# Patient Record
Sex: Female | Born: 1995 | Race: Black or African American | Hispanic: No | Marital: Single | Smoking: Never smoker
Health system: Southern US, Community
[De-identification: ages and names within clinical notes are randomized; demographics above are authoritative.]

## PROBLEM LIST (undated history)

## (undated) DIAGNOSIS — E119 Type 2 diabetes mellitus without complications: Secondary | ICD-10-CM

---
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
## 2017-11-05 ENCOUNTER — Encounter (HOSPITAL_BASED_OUTPATIENT_CLINIC_OR_DEPARTMENT_OTHER): Payer: Self-pay | Admitting: Emergency Medicine

## 2017-11-05 ENCOUNTER — Other Ambulatory Visit: Payer: Self-pay

## 2017-11-05 ENCOUNTER — Emergency Department (HOSPITAL_BASED_OUTPATIENT_CLINIC_OR_DEPARTMENT_OTHER)
Admission: EM | Admit: 2017-11-05 | Discharge: 2017-11-05 | Disposition: A | Discharge status: Home / Self Care | Payer: BLUE CROSS/BLUE SHIELD | Attending: Emergency Medicine | Admitting: Emergency Medicine

## 2017-11-05 DIAGNOSIS — R1032 Left lower quadrant pain: Secondary | ICD-10-CM | POA: Diagnosis present

## 2017-11-05 DIAGNOSIS — N10 Acute tubulo-interstitial nephritis: Secondary | ICD-10-CM | POA: Insufficient documentation

## 2017-11-05 DIAGNOSIS — R739 Hyperglycemia, unspecified: Secondary | ICD-10-CM

## 2017-11-05 DIAGNOSIS — N12 Tubulo-interstitial nephritis, not specified as acute or chronic: Secondary | ICD-10-CM

## 2017-11-05 LAB — CBC WITH DIFFERENTIAL/PLATELET
Basophils Absolute: 0 10*3/uL (ref 0.0–0.1)
Basophils Relative: 0 %
EOS PCT: 0 %
Eosinophils Absolute: 0 10*3/uL (ref 0.0–0.7)
HCT: 33.4 % — ABNORMAL LOW (ref 36.0–46.0)
Hemoglobin: 11 g/dL — ABNORMAL LOW (ref 12.0–15.0)
Lymphocytes Relative: 9 %
Lymphs Abs: 0.9 10*3/uL (ref 0.7–4.0)
MCH: 22.9 pg — ABNORMAL LOW (ref 26.0–34.0)
MCHC: 32.9 g/dL (ref 30.0–36.0)
MCV: 69.6 fL — AB (ref 78.0–100.0)
MONO ABS: 1.1 10*3/uL — AB (ref 0.1–1.0)
MONOS PCT: 11 %
NEUTROS PCT: 80 %
Neutro Abs: 7.9 10*3/uL — ABNORMAL HIGH (ref 1.7–7.7)
PLATELETS: 275 10*3/uL (ref 150–400)
RBC: 4.8 MIL/uL (ref 3.87–5.11)
RDW: 14.7 % (ref 11.5–15.5)
WBC: 9.9 10*3/uL (ref 4.0–10.5)

## 2017-11-05 LAB — BASIC METABOLIC PANEL
Anion gap: 13 (ref 5–15)
BUN: 12 mg/dL (ref 6–20)
CO2: 19 mmol/L — ABNORMAL LOW (ref 22–32)
Calcium: 9 mg/dL (ref 8.9–10.3)
Chloride: 96 mmol/L — ABNORMAL LOW (ref 101–111)
Creatinine, Ser: 0.88 mg/dL (ref 0.44–1.00)
GFR calc Af Amer: >60 mL/min (ref >60)
GLUCOSE: 337 mg/dL — AB (ref 65–99)
Potassium: 3.8 mmol/L (ref 3.5–5.1)
SODIUM: 128 mmol/L — AB (ref 135–145)

## 2017-11-05 LAB — URINALYSIS, ROUTINE W REFLEX MICROSCOPIC
Bilirubin Urine: NEGATIVE
Glucose, UA: >=500 mg/dL — AB
Ketones, ur: >80 mg/dL — AB
Nitrite: POSITIVE — AB
Protein, ur: 100 mg/dL — AB
Specific Gravity, Urine: 1.025 (ref 1.005–1.030)
pH: 6 (ref 5.0–8.0)

## 2017-11-05 LAB — CBG MONITORING, ED: Glucose-Capillary: 340 mg/dL — ABNORMAL HIGH (ref 65–99)

## 2017-11-05 LAB — I-STAT VENOUS BLOOD GAS, ED
Acid-base deficit: 5 mmol/L — ABNORMAL HIGH (ref 0.0–2.0)
Bicarbonate: 19.2 mmol/L — ABNORMAL LOW (ref 20.0–28.0)
O2 Saturation: 73 %
PH VEN: 7.365 (ref 7.250–7.430)
TCO2: 20 mmol/L — ABNORMAL LOW (ref 22–32)
pCO2, Ven: 33.8 mmHg — ABNORMAL LOW (ref 44.0–60.0)
pO2, Ven: 41 mmHg (ref 32.0–45.0)

## 2017-11-05 LAB — URINALYSIS, MICROSCOPIC (REFLEX)

## 2017-11-05 LAB — PREGNANCY, URINE: Preg Test, Ur: NEGATIVE

## 2017-11-05 MED ORDER — METFORMIN HCL 500 MG PO TABS: 500.0000 mg | ORAL_TABLET | Freq: Two times a day (BID) | ORAL | 0 refills | Status: DC

## 2017-11-05 MED ORDER — SODIUM CHLORIDE 0.9 % IV BOLUS
1000.0000 mL | Freq: Once | INTRAVENOUS | Status: AC
  Administered 2017-11-05: 1000 mL via INTRAVENOUS

## 2017-11-05 MED ORDER — CEPHALEXIN 500 MG PO CAPS: 500.0000 mg | ORAL_CAPSULE | Freq: Two times a day (BID) | ORAL | 0 refills | Status: AC

## 2017-11-05 MED ORDER — CEPHALEXIN 500 MG PO CAPS: 500.0000 mg | ORAL_CAPSULE | Freq: Two times a day (BID) | ORAL | 0 refills | Status: DC

## 2017-11-05 MED ORDER — ACETAMINOPHEN 500 MG PO TABS
1000.0000 mg | ORAL_TABLET | Freq: Once | ORAL | Status: AC
  Administered 2017-11-05: 1000 mg via ORAL
  Filled 2017-11-05: qty 2

## 2017-11-05 MED ORDER — SODIUM CHLORIDE 0.9 % IV SOLN
1.0000 g | Freq: Once | INTRAVENOUS | Status: AC
  Administered 2017-11-05: 1 g via INTRAVENOUS
  Filled 2017-11-05: qty 10

## 2017-11-05 NOTE — ED Provider Notes (Signed)
[facility] EMERGENCY DEPARTMENT Provider Note   CSN: [id number] Arrival date & time: 11/05/17  1803  History   Chief Complaint Chief Complaint  Patient presents with  . Abdominal Pain    Lateral    HPI Alice Oliver is a 22 y.o. female.  HPI  Patient presents with L flank pain for 5d. Pain is present constantly, however is worse with deep inspiration and when lying on the opposite side. Describes pain as sharp. Has not taken any meds for pain. Has not tried heating pad or ice packs. Denies dysuria, hematuria. Endorses increased urinary frequency and says she feels as if she cannot fully empty her bladder and only urinates a few drops. Denies N/V, diarrhea, fevers, abd pain. Questionable chills. No new activities or exercises or trauma preceding onset of pain. Currently menstruating.    No past medical history on file.  There are no active problems to display for this patient.      OB History   None      Home Medications    Prior to Admission medications   Medication Sig Start Date End Date Taking? Authorizing Provider  cephALEXin (KEFLEX) 500 MG capsule Take 1 capsule (500 mg total) by mouth 2 (two) times daily for 10 days. 11/05/17 11/15/17  Marquette SaaLancaster, Abigail Joseph, MD  metFORMIN (GLUCOPHAGE) 500 MG tablet Take 1 tablet (500 mg total) by mouth 2 (two) times daily with a meal. 11/05/17 12/05/17  Marquette SaaLancaster, Abigail Joseph, MD    Family History No family history on file.  Social History Social History   Tobacco Use  . Smoking status: Never Smoker  . Smokeless tobacco: Never Used  Substance Use Topics  . Alcohol use: Not on file  . Drug use: Not on file     Allergies   Patient has no known allergies.   Review of Systems Review of Systems  Constitutional: Positive for chills. Negative for activity change, appetite change and fever.  Respiratory: Negative for shortness of breath.   Cardiovascular: Negative for chest pain.    Gastrointestinal: Negative for abdominal pain, diarrhea, nausea and vomiting.  Genitourinary: Positive for decreased urine volume, flank pain (Left) and frequency. Negative for dysuria, vaginal discharge and vaginal pain.   Physical Exam Updated Vital Signs BP 116/75   Pulse (!) 111   Temp 99.5 F (37.5 C) (Oral)   Resp 16   Ht 5\' 7"  (1.702 m)   Wt 110.2 kg (243 lb)   LMP 11/05/2017   SpO2 100%   BMI 38.06 kg/m   Physical Exam  Constitutional: She is oriented to person, place, and time. She appears well-developed and well-nourished.  Non-toxic appearance. She does not appear ill.  HENT:  Head: Normocephalic and atraumatic.  Mouth/Throat: Oropharynx is clear and moist. No oropharyngeal exudate.  Eyes: Pupils are equal, round, and reactive to light. EOM are normal. No scleral icterus.  Neck: Normal range of motion. Neck supple.  Cardiovascular: Normal rate, regular rhythm and normal heart sounds.  No murmur heard. Pulmonary/Chest: Effort normal and breath sounds normal. No respiratory distress. She has no wheezes.  Abdominal: Soft. Normal appearance and bowel sounds are normal. There is no tenderness. There is no CVA tenderness.  Musculoskeletal: Normal range of motion.  Lymphadenopathy:    She has no cervical adenopathy.  Neurological: She is alert and oriented to person, place, and time.  Skin: Skin is warm and dry.  Psychiatric: She has a normal mood and affect. Her behavior is normal.  ED Treatments / Results  Labs (all labs ordered are listed, but only abnormal results are displayed) Labs Reviewed  URINALYSIS, ROUTINE W REFLEX MICROSCOPIC - Abnormal; Notable for the following components:      Result Value   APPearance CLOUDY (*)    Glucose, UA >=500 (*)    Hgb urine dipstick LARGE (*)    Ketones, ur >80 (*)    Protein, ur 100 (*)    Nitrite POSITIVE (*)    Leukocytes, UA SMALL (*)    All other components within normal limits  URINALYSIS, MICROSCOPIC (REFLEX) -  Abnormal; Notable for the following components:   Bacteria, UA MANY (*)    Squamous Epithelial / LPF 0-5 (*)    All other components within normal limits  CBC WITH DIFFERENTIAL/PLATELET - Abnormal; Notable for the following components:   Hemoglobin 11.0 (*)    HCT 33.4 (*)    MCV 69.6 (*)    MCH 22.9 (*)    Neutro Abs 7.9 (*)    Monocytes Absolute 1.1 (*)    All other components within normal limits  BASIC METABOLIC PANEL - Abnormal; Notable for the following components:   Sodium 128 (*)    Chloride 96 (*)    CO2 19 (*)    Glucose, Bld 337 (*)    All other components within normal limits  CBG MONITORING, ED - Abnormal; Notable for the following components:   Glucose-Capillary 340 (*)    All other components within normal limits  I-STAT VENOUS BLOOD GAS, ED - Abnormal; Notable for the following components:   pCO2, Ven 33.8 (*)    Bicarbonate 19.2 (*)    TCO2 20 (*)    Acid-base deficit 5.0 (*)    All other components within normal limits  PREGNANCY, URINE  BLOOD GAS, VENOUS    EKG None  Radiology No results found.  Procedures Procedures (including critical care time)  Medications Ordered in ED Medications  cefTRIAXone (ROCEPHIN) 1 g in sodium chloride 0.9 % 100 mL IVPB (1 g Intravenous New Bag/Given 11/05/17 2119)  acetaminophen (TYLENOL) tablet 1,000 mg (1,000 mg Oral Given 11/05/17 2123)  sodium chloride 0.9 % bolus 1,000 mL (1,000 mLs Intravenous New Bag/Given 11/05/17 2116)     Initial Impression / Assessment and Plan / ED Course  I have reviewed the triage vital signs and the nursing notes.  Pertinent labs & imaging results that were available during my care of the patient were reviewed by me and considered in my medical decision making (see chart for details).     2100 Patient presenting with L flank pain. Also endorsing increased urinary frequency. Sx and UA consistent with UTI, however patient also noted to have >500 glucose on UA. As such, CBG was obtained,  which was elevated at 340. Will get basic labs.   2200 Patient hyperglycemic on additional testing, though not in DKA. Also received IVF and CTX for presumed pyelonephritis. Stable for discharge home. Will prescribe Keflex extended 10d course for pyelo. Also prescribed metformin 500mg  BID and discussed importance of PCP follow up. Also gave information for establishing care with a PCP as patient does not currently have one. Also gave information on Type II DM.    Final Clinical Impressions(s) / ED Diagnoses   Final diagnoses:  Pyelonephritis  Hyperglycemia    ED Discharge Orders        Ordered    cephALEXin (KEFLEX) 500 MG capsule  2 times daily,   Status:  Discontinued  11/05/17 2029    cephALEXin (KEFLEX) 500 MG capsule  2 times daily     11/05/17 2135    metFORMIN (GLUCOPHAGE) 500 MG tablet  2 times daily with meals     11/05/17 2135     Tarri Abernethy, MD, MPH PGY-3 [facility] Pager [phone]    Marquette Saa, MD 11/05/17 2231    Nira Conn, MD 11/05/17 2325

## 2017-11-05 NOTE — ED Triage Notes (Signed)
Pt having left sided lateral abdominal pain since Saturday.  Denies N/V/D.  No fever.  No dysuria.

## 2017-11-05 NOTE — ED Notes (Signed)
DM handout given to pt with instructions, verbal understanding,

## 2017-11-05 NOTE — Discharge Instructions (Addendum)
Please begin taking Keflex (antibiotic) every 12 hours for the next 10 days. You can take Tylenol or ibuprofen as needed for pain.   If your symptoms do not improve by the time you finish the antibiotics, please schedule an appointment with your regular doctor or return to the emergency room.   For high blood sugar, please begin taking metformin one tablet two times a day with a meal. It is VERY important to establish care with a primary doctor. Please call tomorrow morning to schedule an appointment. The two practices below are accepting patients:  [facility] [phone] [facility] [phone]

## 2017-11-05 NOTE — ED Notes (Signed)
ED Provider at bedside. 

## 2018-06-01 ENCOUNTER — Emergency Department (HOSPITAL_BASED_OUTPATIENT_CLINIC_OR_DEPARTMENT_OTHER): Payer: BLUE CROSS/BLUE SHIELD

## 2018-06-01 ENCOUNTER — Other Ambulatory Visit: Payer: Self-pay

## 2018-06-01 ENCOUNTER — Emergency Department (HOSPITAL_BASED_OUTPATIENT_CLINIC_OR_DEPARTMENT_OTHER)
Admission: EM | Admit: 2018-06-01 | Discharge: 2018-06-02 | Disposition: A | Discharge status: Home / Self Care | Payer: BLUE CROSS/BLUE SHIELD | Attending: Emergency Medicine | Admitting: Emergency Medicine

## 2018-06-01 ENCOUNTER — Encounter (HOSPITAL_BASED_OUTPATIENT_CLINIC_OR_DEPARTMENT_OTHER): Payer: Self-pay | Admitting: *Deleted

## 2018-06-01 DIAGNOSIS — N83201 Unspecified ovarian cyst, right side: Secondary | ICD-10-CM

## 2018-06-01 DIAGNOSIS — E119 Type 2 diabetes mellitus without complications: Secondary | ICD-10-CM | POA: Insufficient documentation

## 2018-06-01 DIAGNOSIS — R109 Unspecified abdominal pain: Secondary | ICD-10-CM | POA: Diagnosis present

## 2018-06-01 HISTORY — DX: Type 2 diabetes mellitus without complications: E11.9

## 2018-06-01 LAB — CBG MONITORING, ED: Glucose-Capillary: 123 mg/dL — ABNORMAL HIGH (ref 70–99)

## 2018-06-01 LAB — COMPREHENSIVE METABOLIC PANEL
ALT: 28 U/L (ref 0–44)
AST: 30 U/L (ref 15–41)
Albumin: 4.3 g/dL (ref 3.5–5.0)
Alkaline Phosphatase: 89 U/L (ref 38–126)
Anion gap: 12 (ref 5–15)
BUN: 8 mg/dL (ref 6–20)
CO2: 23 mmol/L (ref 22–32)
CREATININE: 0.65 mg/dL (ref 0.44–1.00)
Calcium: 9.5 mg/dL (ref 8.9–10.3)
Chloride: 99 mmol/L (ref 98–111)
GFR calc Af Amer: >60 mL/min (ref >60)
GFR calc non Af Amer: >60 mL/min (ref >60)
Glucose, Bld: 148 mg/dL — ABNORMAL HIGH (ref 70–99)
Potassium: 3.3 mmol/L — ABNORMAL LOW (ref 3.5–5.1)
SODIUM: 134 mmol/L — AB (ref 135–145)
Total Bilirubin: 0.5 mg/dL (ref 0.3–1.2)
Total Protein: 8.7 g/dL — ABNORMAL HIGH (ref 6.5–8.1)

## 2018-06-01 LAB — URINALYSIS, ROUTINE W REFLEX MICROSCOPIC
Bilirubin Urine: NEGATIVE
GLUCOSE, UA: NEGATIVE mg/dL
HGB URINE DIPSTICK: NEGATIVE
KETONES UR: 15 mg/dL — AB
Nitrite: NEGATIVE
PROTEIN: NEGATIVE mg/dL
Specific Gravity, Urine: 1.01 (ref 1.005–1.030)
pH: 6 (ref 5.0–8.0)

## 2018-06-01 LAB — CBC WITH DIFFERENTIAL/PLATELET
ABS IMMATURE GRANULOCYTES: 0.02 10*3/uL (ref 0.00–0.07)
BASOS ABS: 0 10*3/uL (ref 0.0–0.1)
Basophils Relative: 0 %
EOS PCT: 0 %
Eosinophils Absolute: 0 10*3/uL (ref 0.0–0.5)
HEMATOCRIT: 38.8 % (ref 36.0–46.0)
HEMOGLOBIN: 11.8 g/dL — AB (ref 12.0–15.0)
Immature Granulocytes: 0 %
LYMPHS ABS: 2.6 10*3/uL (ref 0.7–4.0)
LYMPHS PCT: 36 %
MCH: 22.9 pg — ABNORMAL LOW (ref 26.0–34.0)
MCHC: 30.4 g/dL (ref 30.0–36.0)
MCV: 75.2 fL — ABNORMAL LOW (ref 80.0–100.0)
Monocytes Absolute: 0.3 10*3/uL (ref 0.1–1.0)
Monocytes Relative: 4 %
NRBC: 0 % (ref 0.0–0.2)
Neutro Abs: 4.3 10*3/uL (ref 1.7–7.7)
Neutrophils Relative %: 60 %
PLATELETS: 254 10*3/uL (ref 150–400)
RBC: 5.16 MIL/uL — ABNORMAL HIGH (ref 3.87–5.11)
RDW: 14.6 % (ref 11.5–15.5)
WBC: 7.2 10*3/uL (ref 4.0–10.5)

## 2018-06-01 LAB — LIPASE, BLOOD: LIPASE: 28 U/L (ref 11–51)

## 2018-06-01 LAB — PREGNANCY, URINE: Preg Test, Ur: NEGATIVE

## 2018-06-01 LAB — URINALYSIS, MICROSCOPIC (REFLEX)

## 2018-06-01 MED ORDER — METFORMIN HCL 500 MG PO TABS: 500.0000 mg | ORAL_TABLET | Freq: Two times a day (BID) | ORAL | 0 refills | Status: DC

## 2018-06-01 MED ORDER — IOPAMIDOL (ISOVUE-300) INJECTION 61%
100.0000 mL | Freq: Once | INTRAVENOUS | Status: AC | PRN
  Administered 2018-06-01: 100 mL via INTRAVENOUS

## 2018-06-01 NOTE — ED Triage Notes (Signed)
Mid abdominal pain x 3 days. She vomited today. She denies vaginal discharge.

## 2018-06-01 NOTE — ED Notes (Signed)
Pt resting with eyes closed. NAD noted. Family at bedside.

## 2018-06-01 NOTE — ED Notes (Signed)
Called lab to add on tests.

## 2018-06-01 NOTE — ED Provider Notes (Signed)
[facility] EMERGENCY DEPARTMENT Provider Note   CSN: [id number] Arrival date & time: 06/01/18  1911     History   Chief Complaint Chief Complaint  Patient presents with  . Abdominal Pain    HPI Alice Oliver is a 22 y.o. female.  22 year old female with history of type 2 diabetes who presents with abdominal pain.  Patient states that she has had constant, central abdominal pain for the past 3 days associated with one episode of vomiting today.  She has had decreased appetite but has been tolerating liquids.  Pain is worse with movement.  Nothing makes it better.  It is not associated with eating.  She denies any associated fevers, constipation, diarrhea, urinary symptoms, vaginal bleeding, or vaginal discharge.  She reports that she is thirsty and urinates a lot all the time.  She is not currently on her metformin because she ran out of the medication.  The history is provided by the patient.  Abdominal Pain      Past Medical History:  Diagnosis Date  . Diabetes mellitus without complication (HCC)     There are no active problems to display for this patient.   History reviewed. No pertinent surgical history.   OB History   None      Home Medications    Prior to Admission medications   Medication Sig Start Date End Date Taking? Authorizing Provider  metFORMIN (GLUCOPHAGE) 500 MG tablet Take 1 tablet (500 mg total) by mouth 2 (two) times daily. 06/01/18   Diane Mochizuki, Ambrose Finland, MD    Family History No family history on file.  Social History Social History   Tobacco Use  . Smoking status: Never Smoker  . Smokeless tobacco: Never Used  Substance Use Topics  . Alcohol use: Never    Frequency: Never  . Drug use: Never     Allergies   Patient has no known allergies.   Review of Systems Review of Systems  Gastrointestinal: Positive for abdominal pain.   All other systems reviewed and are negative except that which was mentioned in  HPI  Physical Exam Updated Vital Signs BP 119/84 (BP Location: Left Arm)   Pulse 70   Temp 100 F (37.8 C) (Oral)   Resp 16   Ht 5\' 7"  (1.702 m)   Wt 99.8 kg   LMP 05/23/2018   SpO2 100%   BMI 34.46 kg/m   Physical Exam  Constitutional: She is oriented to person, place, and time. She appears well-developed and well-nourished. No distress.  HENT:  Head: Normocephalic and atraumatic.  Moist mucous membranes  Eyes: Conjunctivae are normal.  Neck: Neck supple.  Cardiovascular: Normal rate, regular rhythm and normal heart sounds.  No murmur heard. Pulmonary/Chest: Effort normal and breath sounds normal.  Abdominal: Soft. Bowel sounds are normal. She exhibits no distension. There is tenderness in the right lower quadrant. There is no rebound and no guarding.  Musculoskeletal: She exhibits no edema.  Neurological: She is alert and oriented to person, place, and time.  Fluent speech  Skin: Skin is warm and dry.  Psychiatric: She has a normal mood and affect. Judgment normal.  Nursing note and vitals reviewed.    ED Treatments / Results  Labs (all labs ordered are listed, but only abnormal results are displayed) Labs Reviewed  URINALYSIS, ROUTINE W REFLEX MICROSCOPIC - Abnormal; Notable for the following components:      Result Value   Ketones, ur 15 (*)    Leukocytes, UA TRACE (*)  All other components within normal limits  URINALYSIS, MICROSCOPIC (REFLEX) - Abnormal; Notable for the following components:   Bacteria, UA FEW (*)    All other components within normal limits  COMPREHENSIVE METABOLIC PANEL - Abnormal; Notable for the following components:   Sodium 134 (*)    Potassium 3.3 (*)    Glucose, Bld 148 (*)    Total Protein 8.7 (*)    All other components within normal limits  CBC WITH DIFFERENTIAL/PLATELET - Abnormal; Notable for the following components:   RBC 5.16 (*)    Hemoglobin 11.8 (*)    MCV 75.2 (*)    MCH 22.9 (*)    All other components within  normal limits  CBG MONITORING, ED - Abnormal; Notable for the following components:   Glucose-Capillary 123 (*)    All other components within normal limits  PREGNANCY, URINE  LIPASE, BLOOD    EKG None  Radiology Ct Abdomen Pelvis W Contrast  Result Date: 06/01/2018 CLINICAL DATA:  Lower abdominal pain for 4 days. Nausea and vomiting. EXAM: CT ABDOMEN AND PELVIS WITH CONTRAST TECHNIQUE: Multidetector CT imaging of the abdomen and pelvis was performed using the standard protocol following bolus administration of intravenous contrast. CONTRAST:  ISOVUE-300 IOPAMIDOL (ISOVUE-300) INJECTION 61% COMPARISON:  None. FINDINGS: Lower chest: Lung bases are clear. Hepatobiliary: No focal liver abnormality is seen. No gallstones, gallbladder wall thickening, or biliary dilatation. Pancreas: Unremarkable. No pancreatic ductal dilatation or surrounding inflammatory changes. Spleen: Normal in size without focal abnormality. Adrenals/Urinary Tract: Adrenal glands are unremarkable. Kidneys are normal, without renal calculi, focal lesion, or hydronephrosis. Bladder is unremarkable. Stomach/Bowel: Stomach, small bowel, and colon are mostly decompressed. No wall thickening or inflammatory changes are appreciated. Appendix is normal. Vascular/Lymphatic: No significant vascular findings are present. No enlarged abdominal or pelvic lymph nodes. Reproductive: Uterus is not enlarged. Complex cystic changes in the right ovary with calcification, measuring about 2.4 x 3.9 cm in diameter. Probable dermoid cyst. Small amount of free fluid in the pelvis is likely physiologic. Other: No free air in the abdomen. Abdominal wall musculature appears intact. Musculoskeletal: No acute or significant osseous findings. IMPRESSION: No acute process demonstrated in the abdomen or pelvis. No evidence of bowel obstruction or inflammation. Complex cystic structure in the right ovary with calcification, likely dermoid cyst. Small amount of  free fluid in the pelvis is likely physiologic. Electronically Signed   By: Burman Nieves M.D.   On: 06/01/2018 23:34    Procedures Procedures (including critical care time)  Medications Ordered in ED Medications  iopamidol (ISOVUE-300) 61 % injection 100 mL (100 mLs Intravenous Contrast Given 06/01/18 2313)     Initial Impression / Assessment and Plan / ED Course  I have reviewed the triage vital signs and the nursing notes.  Pertinent labs & imaging results that were available during my care of the patient were reviewed by me and considered in my medical decision making (see chart for details).    Pt well appearing, VS notable for mild HTN. Afebrile. RLQ tenderness noted on exam. UA unremarkable, UPT negative, CMP and CBC reassuring.  Lipase negative.  On repeat exam, she had tenderness across her lower abdomen including left lower quadrant, suprapubic abdomen and right lower quadrant.  I had a long discussion regarding risks and benefits of CT scan for evaluation of appendicitis versus observation of her symptoms.  Patient voiced understanding of options and wanted to proceed with imaging.  CT shows no evidence of appendicitis, she does have likely  dermoid cyst on right ovary.  Given that her pain has been migratory, sometimes central abdomen and sometimes right-sided, I doubt acute ovarian torsion.  I have recommended that she follow-up with OB/GYN for ultrasound and further evaluation.  Extensively reviewed return precautions and she voiced understanding.  Final Clinical Impressions(s) / ED Diagnoses   Final diagnoses:  Abdominal pain, unspecified abdominal location  Right ovarian cyst    ED Discharge Orders         Ordered    metFORMIN (GLUCOPHAGE) 500 MG tablet  2 times daily     06/01/18 2355           Galit Urich, Ambrose Finland, MD 06/02/18 0003

## 2019-03-31 IMAGING — CT CT ABD-PELV W/ CM
2 of 4 series · 16 of 46 positions shown, 18 images · IV contrast (APPLIED)
Comparison: None.

CLINICAL DATA: Lower abdominal pain for 4 days. Nausea and
vomiting.

EXAM:
CT ABDOMEN AND PELVIS WITH CONTRAST
TECHNIQUE: Multidetector CT imaging of the abdomen and pelvis was performed
using the standard protocol following bolus administration of
intravenous contrast.
CONTRAST:  100mL OSP867-AXX IOPAMIDOL (OSP867-AXX) INJECTION 61%

[Series 2: axial st · axial · 0.90mm/px · z∈[-508,-63]mm · 13 of 99 slices shown, 15 images]
[im 5/99  soft-tissue]
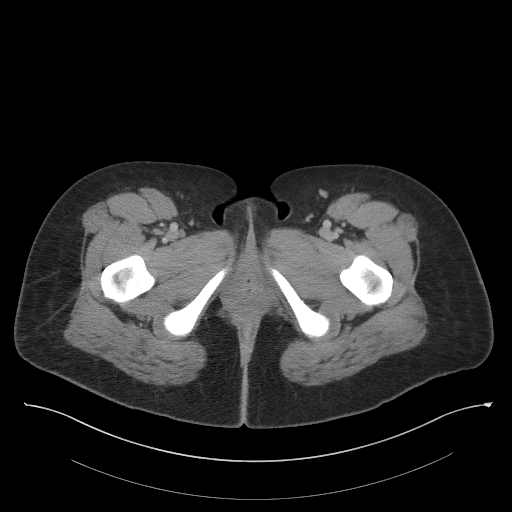
[im 5/99  bone]
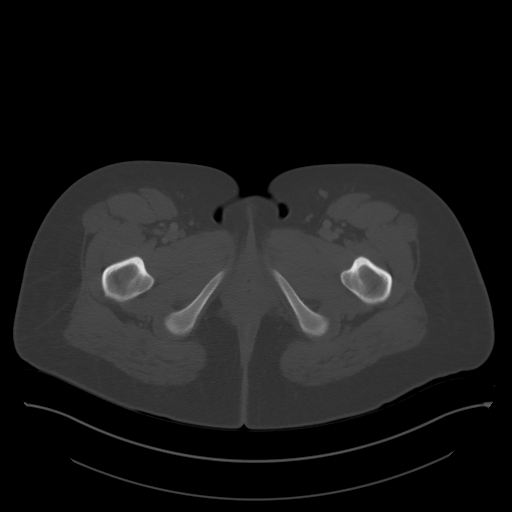
[im 13/99  soft-tissue]
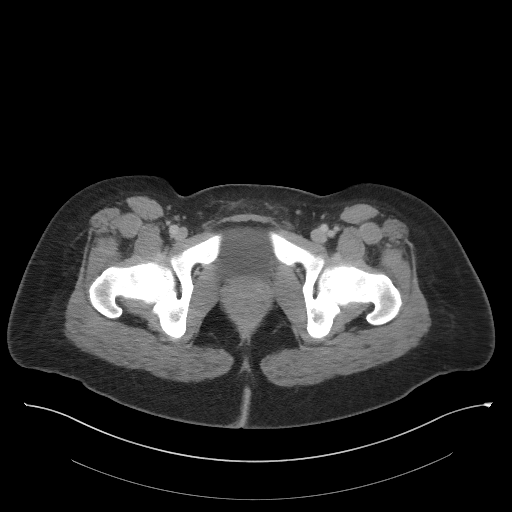
[im 21/99  soft-tissue]
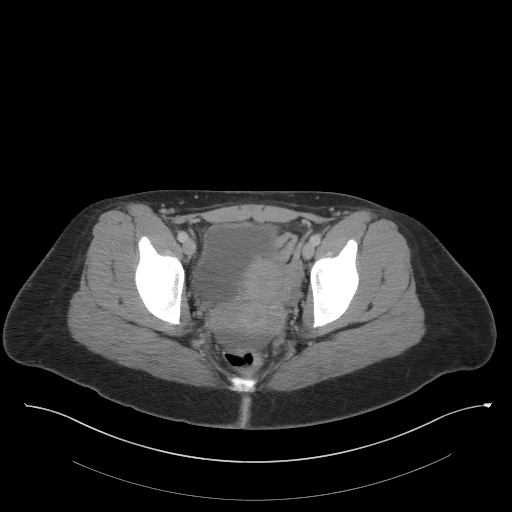
[im 29/99  soft-tissue]
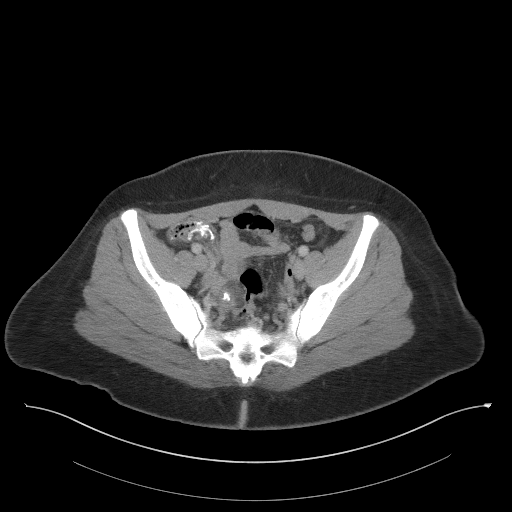
[im 33/99  soft-tissue]
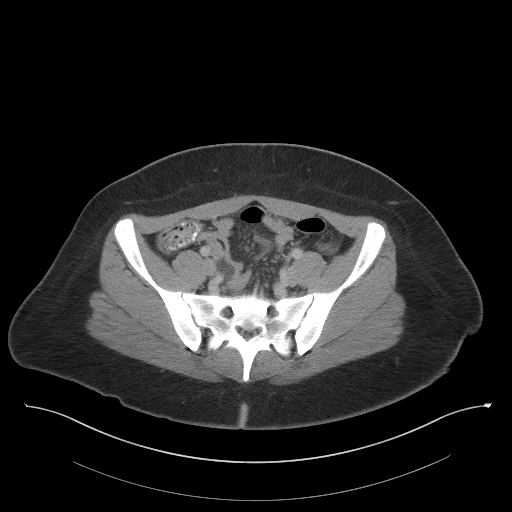
[im 41/99  soft-tissue]
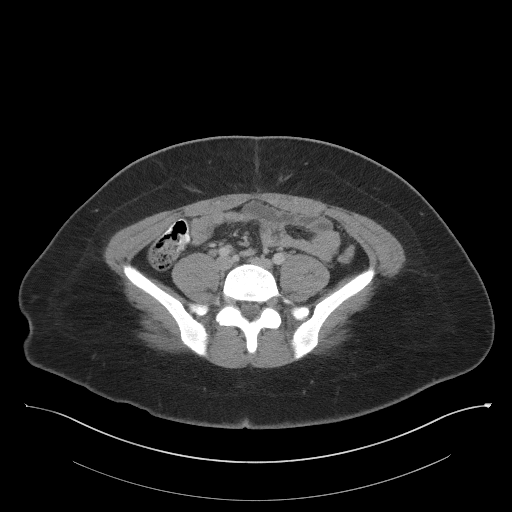
[im 50/99  soft-tissue]
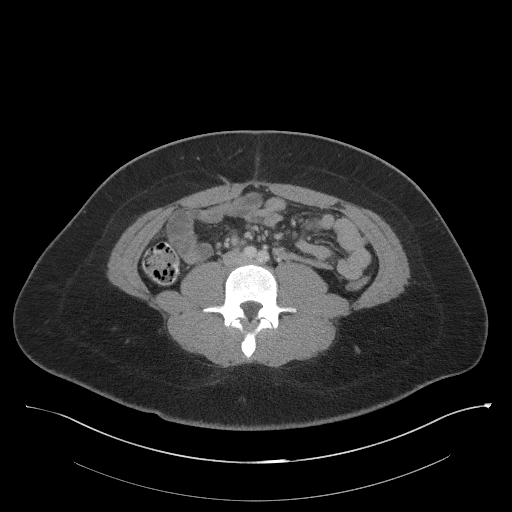
[im 58/99  soft-tissue]
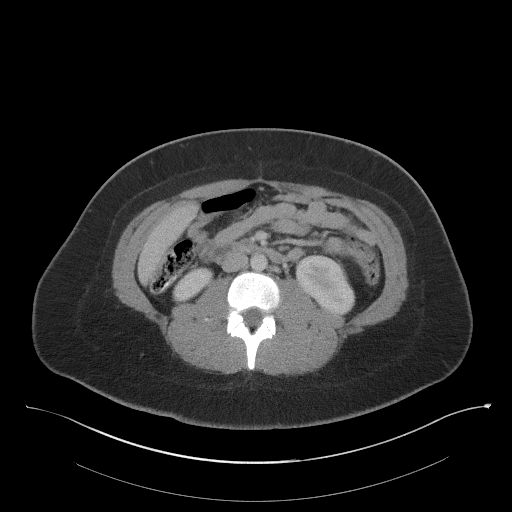
[im 66/99  soft-tissue]
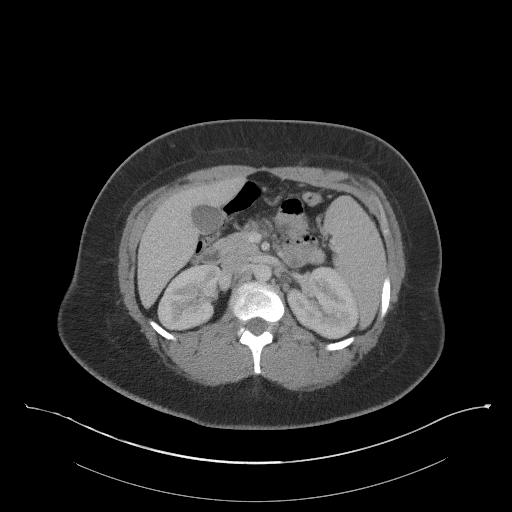
[im 66/99  bone]
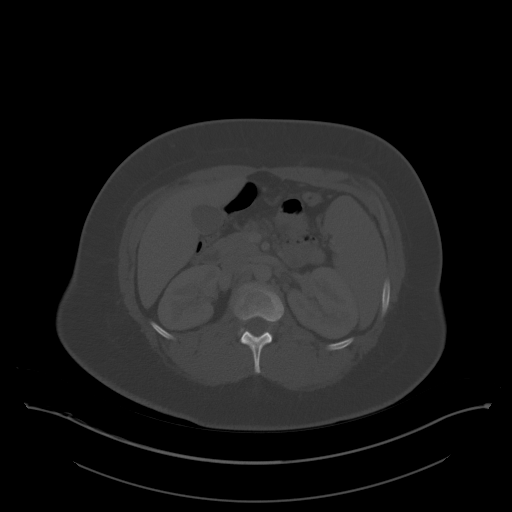
[im 70/99  soft-tissue]
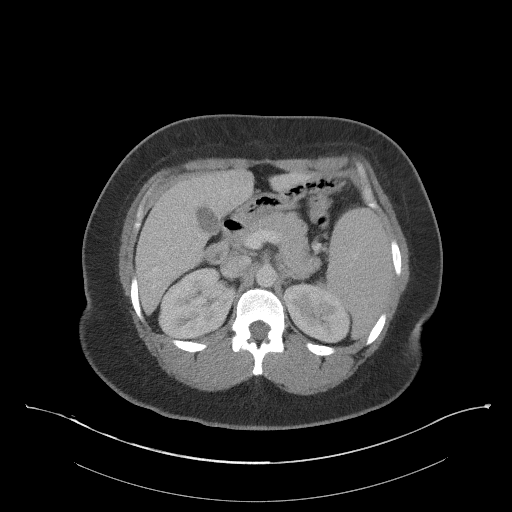
[im 78/99  soft-tissue]
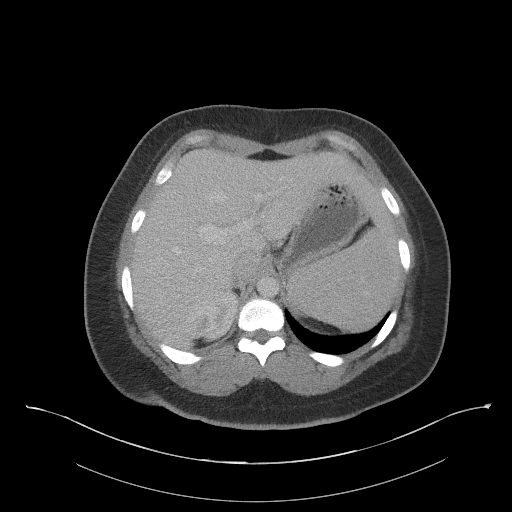
[im 86/99  soft-tissue]
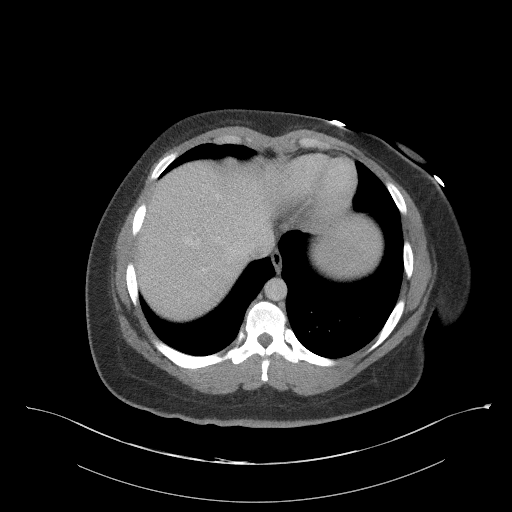
[im 94/99  soft-tissue]
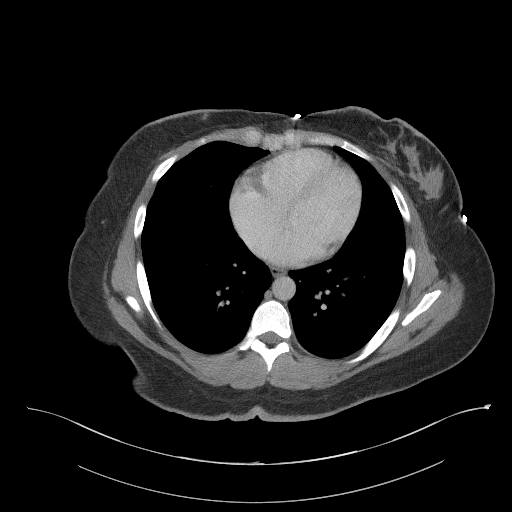

[Series 5: coronal st · coronal · 0.77mm/px · 3 of 84 slices shown]
[im 28/84  soft-tissue]
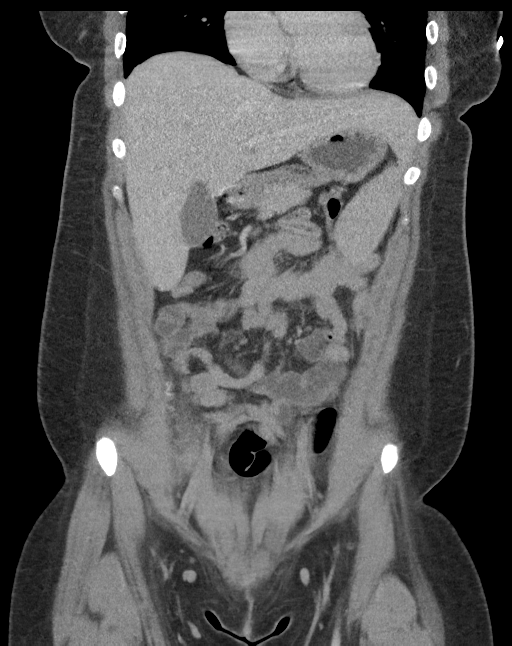
[im 37/84  soft-tissue]
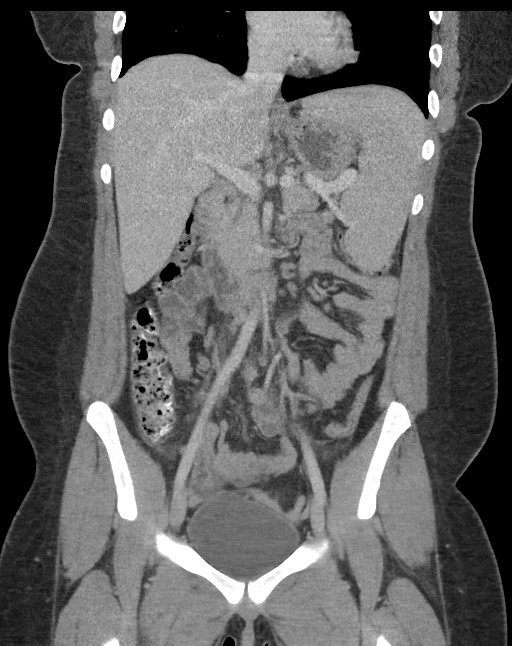
[im 47/84  soft-tissue]
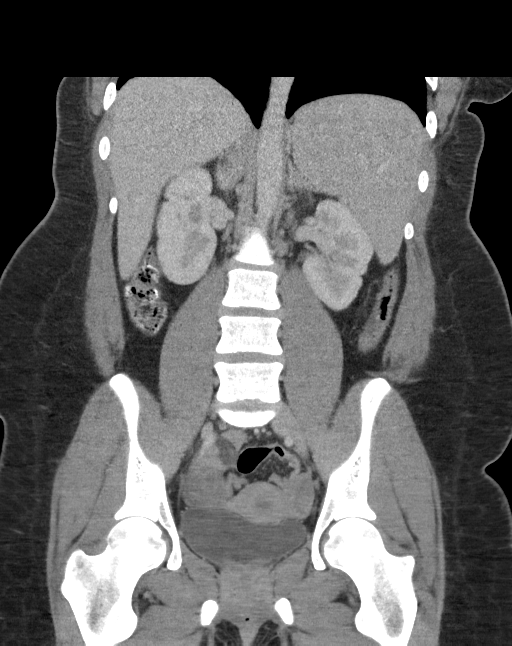

[16 of 46 positions shown; findings below may reference images not displayed]

FINDINGS: Lower chest: Lung bases are clear.

Hepatobiliary: No focal liver abnormality is seen. No gallstones,
gallbladder wall thickening, or biliary dilatation.

Pancreas: Unremarkable. No pancreatic ductal dilatation or
surrounding inflammatory changes.

Spleen: Normal in size without focal abnormality.

Adrenals/Urinary Tract: Adrenal glands are unremarkable. Kidneys are
normal, without renal calculi, focal lesion, or hydronephrosis.
Bladder is unremarkable.

Stomach/Bowel: Stomach, small bowel, and colon are mostly
decompressed. No wall thickening or inflammatory changes are
appreciated. Appendix is normal.

Vascular/Lymphatic: No significant vascular findings are present. No
enlarged abdominal or pelvic lymph nodes.

Reproductive: Uterus is not enlarged. Complex cystic changes in the
right ovary with calcification, measuring about 2.4 x 3.9 cm in
diameter. Probable dermoid cyst. Small amount of free fluid in the
pelvis is likely physiologic.

Other: No free air in the abdomen. Abdominal wall musculature
appears intact.

Musculoskeletal: No acute or significant osseous findings.
IMPRESSION: No acute process demonstrated in the abdomen or pelvis. No evidence
of bowel obstruction or inflammation. Complex cystic structure in
the right ovary with calcification, likely dermoid cyst. Small
amount of free fluid in the pelvis is likely physiologic.

## 2020-12-22 ENCOUNTER — Emergency Department (HOSPITAL_BASED_OUTPATIENT_CLINIC_OR_DEPARTMENT_OTHER)
Admission: EM | Admit: 2020-12-22 | Discharge: 2020-12-23 | Disposition: A | Discharge status: Home / Self Care | Payer: BC Managed Care – PPO | Attending: Emergency Medicine | Admitting: Emergency Medicine

## 2020-12-22 ENCOUNTER — Other Ambulatory Visit: Payer: Self-pay

## 2020-12-22 ENCOUNTER — Encounter (HOSPITAL_BASED_OUTPATIENT_CLINIC_OR_DEPARTMENT_OTHER): Payer: Self-pay | Admitting: *Deleted

## 2020-12-22 DIAGNOSIS — Z7984 Long term (current) use of oral hypoglycemic drugs: Secondary | ICD-10-CM | POA: Diagnosis not present

## 2020-12-22 DIAGNOSIS — E1165 Type 2 diabetes mellitus with hyperglycemia: Secondary | ICD-10-CM | POA: Diagnosis not present

## 2020-12-22 DIAGNOSIS — L02412 Cutaneous abscess of left axilla: Secondary | ICD-10-CM | POA: Diagnosis present

## 2020-12-22 DIAGNOSIS — R739 Hyperglycemia, unspecified: Secondary | ICD-10-CM

## 2020-12-22 LAB — CBG MONITORING, ED: Glucose-Capillary: 294 mg/dL — ABNORMAL HIGH (ref 70–99)

## 2020-12-22 MED ORDER — CEPHALEXIN 250 MG PO CAPS
1000.0000 mg | ORAL_CAPSULE | Freq: Once | ORAL | Status: AC
  Administered 2020-12-22: 1000 mg via ORAL
  Filled 2020-12-22: qty 4

## 2020-12-22 MED ORDER — METFORMIN HCL 500 MG PO TABS: 500.0000 mg | ORAL_TABLET | Freq: Two times a day (BID) | ORAL | 0 refills | Status: AC

## 2020-12-22 MED ORDER — LIDOCAINE-EPINEPHRINE (PF) 2 %-1:200000 IJ SOLN
20.0000 mL | Freq: Once | INTRAMUSCULAR | Status: AC
  Administered 2020-12-22: 20 mL
  Filled 2020-12-22: qty 20

## 2020-12-22 MED ORDER — ACETAMINOPHEN 500 MG PO TABS
1000.0000 mg | ORAL_TABLET | Freq: Once | ORAL | Status: AC
  Administered 2020-12-22: 1000 mg via ORAL
  Filled 2020-12-22: qty 2

## 2020-12-22 MED ORDER — IBUPROFEN 400 MG PO TABS
600.0000 mg | ORAL_TABLET | Freq: Once | ORAL | Status: AC
  Administered 2020-12-22: 600 mg via ORAL
  Filled 2020-12-22: qty 1

## 2020-12-22 MED ORDER — DOXYCYCLINE HYCLATE 100 MG PO CAPS: 100.0000 mg | ORAL_CAPSULE | Freq: Two times a day (BID) | ORAL | 0 refills | Status: AC

## 2020-12-22 NOTE — Discharge Instructions (Addendum)
It was our pleasure to provide your ER care today - we hope that you feel better.  Keep area very clean.  Take doxycycline (antibiotic) as prescribed. Take ibuprofen or acetaminophen as need.   Follow up with primary care doctor, or urgent care, in two days time for wound check and packing removal.  Your blood sugar is elevated tonight - drink plenty of water/fluids, take metformin as prescribed, check blood sugars 4x/day and record values, and follow up with primary care doctor this coming week.   Return to ER if worse, new symptoms, high fevers, new, worsening or severe pain, increased swelling, spreading redness, or other concern.

## 2020-12-22 NOTE — ED Triage Notes (Signed)
Pt reports hx of "boils" on her legs. Tonight she has one under her left axilla x 1 week that is painful

## 2020-12-22 NOTE — ED Provider Notes (Addendum)
[facility] EMERGENCY DEPARTMENT Provider Note   CSN: [id number] Arrival date & time: 12/22/20  2140     History Chief Complaint  Patient presents with  . Recurrent Skin Infections    Alice Oliver is a 25 y.o. female.  Patient with painful/swollen area to left axilla for past 1 week. Symptoms acute onset, moderate, constant, persistent, dull. No recent injury to area. No bite or sting. Had prior boil on leg, but never had to have abscess I and D'd. Denies known hx mrsa. Does not feel sick or ill. No fever or chills. No nv. No other skin lesions or abscesses.   The history is provided by the patient.       Past Medical History:  Diagnosis Date  . Diabetes mellitus without complication (HCC)     There are no problems to display for this patient.   History reviewed. No pertinent surgical history.   OB History   No obstetric history on file.     No family history on file.  Social History   Tobacco Use  . Smoking status: Never Smoker  . Smokeless tobacco: Never Used  Substance Use Topics  . Alcohol use: Not Currently  . Drug use: Yes    Types: Marijuana    Home Medications Prior to Admission medications   Medication Sig Start Date End Date Taking? Authorizing Provider  metFORMIN (GLUCOPHAGE) 500 MG tablet Take 1 tablet (500 mg total) by mouth 2 (two) times daily. 06/01/18   Little, Ambrose Finland, MD    Allergies    Patient has no known allergies.  Review of Systems   Review of Systems  Constitutional: Negative for diaphoresis and fever.  Gastrointestinal: Negative for nausea and vomiting.  Musculoskeletal:       Abscess left axilla.   Skin:       Abscess left axilla    Physical Exam Updated Vital Signs BP 125/86 (BP Location: Right Arm)   Pulse (!) 112   Temp 99.5 F (37.5 C) (Oral)   Resp 16   Ht 1.702 m (5\' 7" )   Wt 104.3 kg   LMP 12/16/2020   SpO2 100%   BMI 36.02 kg/m   Physical Exam Vitals and nursing note  reviewed.  Constitutional:      Appearance: Normal appearance. She is well-developed.  HENT:     Head: Atraumatic.     Nose: Nose normal.     Mouth/Throat:     Mouth: Mucous membranes are moist.  Eyes:     General: No scleral icterus.    Conjunctiva/sclera: Conjunctivae normal.  Neck:     Trachea: No tracheal deviation.  Cardiovascular:     Rate and Rhythm: Normal rate.     Pulses: Normal pulses.  Pulmonary:     Effort: Pulmonary effort is normal. No respiratory distress.  Genitourinary:    Comments: No cva tenderness.  Musculoskeletal:     Cervical back: Neck supple. No rigidity. No muscular tenderness.     Comments: Left axillary abscess, 6 cm diameter, mild surrounding erythema. No crepitus.   Skin:    General: Skin is warm and dry.     Findings: No rash.  Neurological:     Mental Status: She is alert.     Comments: Alert, speech normal.   Psychiatric:        Mood and Affect: Mood normal.     ED Results / Procedures / Treatments   Labs (all labs ordered are listed, but only abnormal  results are displayed) Results for orders placed or performed during the hospital encounter of 12/22/20  CBG monitoring, ED  Result Value Ref Range   Glucose-Capillary 294 (H) 70 - 99 mg/dL    EKG None  Radiology No results found.  Procedures .Marland KitchenIncision and Drainage  Date/Time: 12/22/2020 11:28 PM Performed by: Cathren Laine, MD Authorized by: Cathren Laine, MD   Consent:    Consent given by:  Patient Location:    Type:  Abscess   Location:  Trunk   Trunk location: left axilla. Pre-procedure details:    Skin preparation:  Povidone-iodine Anesthesia:    Anesthesia method:  Local infiltration   Local anesthetic:  Lidocaine 2% WITH epi Procedure type:    Complexity:  Complex Procedure details:    Incision types:  Elliptical   Incision depth:  Subcutaneous   Wound management:  Probed and deloculated and irrigated with saline   Drainage:  Purulent   Drainage amount:   Copious   Wound treatment:  Wound left open and drain placed   Packing materials:  1/2 in iodoform gauze Post-procedure details:    Procedure completion:  Tolerated well, no immediate complications     Medications Ordered in ED Medications  lidocaine-EPINEPHrine (XYLOCAINE W/EPI) 2 %-1:200000 (PF) injection 20 mL (has no administration in time range)    ED Course  I have reviewed the triage vital signs and the nursing notes.  Pertinent labs & imaging results that were available during my care of the patient were reviewed by me and considered in my medical decision making (see chart for details).    MDM Rules/Calculators/A&P                         Incision and drainage.   Reviewed nursing notes and prior charts for additional history.   I and D completed. Sterile dressing. Confirmed nkda. Ibuprofen po, acetaminophen po. Keflex po.   Labs reviewed/interpreted by me - glucose elevated. No polyuria or polydipsia. No nv.  Pt indicates is supposed to be taking metformin but hasnt been taking, and needs new rx. No wt loss. No nv. No polyuria/polydipsia - discussed importance of close pcp f/u.   Rec pcp f/u 2 days for wound check and packing removal, and f/u diabetes. rx keflex.   Return precautions provided.      Final Clinical Impression(s) / ED Diagnoses Final diagnoses:  None    Rx / DC Orders ED Discharge Orders    None        Cathren Laine, MD 12/22/20 2335

## 2020-12-25 ENCOUNTER — Encounter (HOSPITAL_BASED_OUTPATIENT_CLINIC_OR_DEPARTMENT_OTHER): Payer: Self-pay | Admitting: *Deleted

## 2020-12-25 ENCOUNTER — Emergency Department (HOSPITAL_BASED_OUTPATIENT_CLINIC_OR_DEPARTMENT_OTHER)
Admission: EM | Admit: 2020-12-25 | Discharge: 2020-12-25 | Disposition: A | Discharge status: Home / Self Care | Payer: BC Managed Care – PPO | Attending: Emergency Medicine | Admitting: Emergency Medicine

## 2020-12-25 ENCOUNTER — Other Ambulatory Visit: Payer: Self-pay

## 2020-12-25 DIAGNOSIS — Z48817 Encounter for surgical aftercare following surgery on the skin and subcutaneous tissue: Secondary | ICD-10-CM | POA: Diagnosis not present

## 2020-12-25 DIAGNOSIS — Z5189 Encounter for other specified aftercare: Secondary | ICD-10-CM

## 2020-12-25 DIAGNOSIS — Z7984 Long term (current) use of oral hypoglycemic drugs: Secondary | ICD-10-CM | POA: Insufficient documentation

## 2020-12-25 DIAGNOSIS — E119 Type 2 diabetes mellitus without complications: Secondary | ICD-10-CM | POA: Diagnosis not present

## 2020-12-25 NOTE — ED Triage Notes (Signed)
Wound check. She is here to have packing removed from her left axilla.

## 2020-12-25 NOTE — Discharge Instructions (Addendum)
You came to the emerge department today to have your wound rechecked and packing removed.  Your wound had no signs of infection at this time.  Please continue to take antibiotics as prescribed.  Please follow-up with your primary care provider.  You may have diarrhea from the antibiotics.  It is very important that you continue to take the antibiotics even if you get diarrhea unless a medical professional tells you that you may stop taking them.  If you stop too early the bacteria you are being treated for will become stronger and you may need different, more powerful antibiotics that have more side effects and worsening diarrhea.  Please stay well hydrated and consider probiotics as they may decrease the severity of your diarrhea.  Please be aware that if you take any hormonal contraception (birth control pills, nexplanon, the ring, etc) that your birth control will not work while you are taking antibiotics and you need to use back up protection as directed on the birth control medication information insert.   Get help right away if: You have severe pain or bleeding. You cannot eat or drink without vomiting. You have decreased urine output. You become short of breath. You have chest pain. You cough up blood. The affected area becomes numb or starts to tingle.

## 2020-12-25 NOTE — ED Provider Notes (Signed)
[facility] EMERGENCY DEPARTMENT Provider Note   CSN: [id number] Arrival date & time: 12/25/20  1232     History Chief Complaint  Patient presents with  . Wound Check    Alice Oliver is a 25 y.o. female with a history of diabetes.  Patient presents to the Emergency Department for wound recheck and packing removal.  Patient was seen in emergency department on 5/28 for abscess of the left axilla.  Incision and drainage was performed and packing was placed.  Patient was started on Keflex.  Patient reports she has had no pain, fevers, chills, erythema, rash, or increased drainage from her wound site.  She has been taking antibiotics as prescribed.  Patient has not been performing her warm compresses.  HPI     Past Medical History:  Diagnosis Date  . Diabetes mellitus without complication (HCC)     There are no problems to display for this patient.   History reviewed. No pertinent surgical history.   OB History   No obstetric history on file.     No family history on file.  Social History   Tobacco Use  . Smoking status: Never Smoker  . Smokeless tobacco: Never Used  Substance Use Topics  . Alcohol use: Not Currently  . Drug use: Yes    Types: Marijuana    Home Medications Prior to Admission medications   Medication Sig Start Date End Date Taking? Authorizing Provider  doxycycline (VIBRAMYCIN) 100 MG capsule Take 1 capsule (100 mg total) by mouth 2 (two) times daily. 12/22/20  Yes Cathren Laine, MD  metFORMIN (GLUCOPHAGE) 500 MG tablet Take 1 tablet (500 mg total) by mouth 2 (two) times daily with a meal. 12/22/20  Yes Cathren Laine, MD    Allergies    Patient has no known allergies.  Review of Systems   Review of Systems  Constitutional: Negative for chills and fever.  Skin: Negative for color change, rash and wound.    Physical Exam Updated Vital Signs BP 117/76 (BP Location: Right Arm)   Pulse 90   Temp 98.5 F (36.9 C) (Oral)    Resp 14   Ht 5\' 7"  (1.702 m)   Wt 104.3 kg   LMP 12/16/2020   SpO2 94%   BMI 36.01 kg/m   Physical Exam Vitals and nursing note reviewed.  Constitutional:      General: She is not in acute distress.    Appearance: She is not ill-appearing, toxic-appearing or diaphoretic.  HENT:     Head: Normocephalic.  Eyes:     General: No scleral icterus.       Right eye: No discharge.        Left eye: No discharge.  Cardiovascular:     Rate and Rhythm: Normal rate.  Pulmonary:     Effort: Pulmonary effort is normal.  Skin:    General: Skin is warm and dry.     Comments: Patient has wound to left axilla with packing in place, no surrounding rash or erythema.  Patient does have surrounding induration, no areas of fluctuance.  No bleeding or purulent discharge  Neurological:     General: No focal deficit present.     Mental Status: She is alert.     GCS: GCS eye subscore is 4. GCS verbal subscore is 5. GCS motor subscore is 6.  Psychiatric:        Behavior: Behavior is cooperative.     ED Results / Procedures / Treatments   Labs (  all labs ordered are listed, but only abnormal results are displayed) Labs Reviewed - No data to display  EKG None  Radiology No results found.  Procedures Procedures   Medications Ordered in ED Medications - No data to display  ED Course  I have reviewed the triage vital signs and the nursing notes.  Pertinent labs & imaging results that were available during my care of the patient were reviewed by me and considered in my medical decision making (see chart for details).    MDM Rules/Calculators/A&P                          Alert 25 year old female in no acute distress, nontoxic-appearing.  Patient presents with chief complaint of wound check and packing removal.  Patient reports she has had no pain, fevers, chills, erythema, rash, or increased drainage from her wound site.  Wound packing removed without difficulty or complications.  No signs of  infection.  Advised patient to continue taking antibiotics prescribed.  Advised patient to complete warm compresses 2-3 times daily.  Patient to follow-up with primary care provider.  Patient given strict return precautions.  Patient expressed understanding instructions and is agreeable with this plan.  Final Clinical Impression(s) / ED Diagnoses Final diagnoses:  None    Rx / DC Orders ED Discharge Orders    None       Berneice Heinrich 12/25/20 1443    Tilden Fossa, MD 12/28/20 1006

## 2021-06-24 ENCOUNTER — Encounter (HOSPITAL_BASED_OUTPATIENT_CLINIC_OR_DEPARTMENT_OTHER): Payer: Self-pay

## 2021-06-24 ENCOUNTER — Other Ambulatory Visit: Payer: Self-pay

## 2021-06-24 ENCOUNTER — Emergency Department (HOSPITAL_BASED_OUTPATIENT_CLINIC_OR_DEPARTMENT_OTHER)
Admission: EM | Admit: 2021-06-24 | Discharge: 2021-06-24 | Disposition: A | Discharge status: Home / Self Care | Payer: BC Managed Care – PPO | Attending: Emergency Medicine | Admitting: Emergency Medicine

## 2021-06-24 DIAGNOSIS — H9203 Otalgia, bilateral: Secondary | ICD-10-CM | POA: Diagnosis not present

## 2021-06-24 DIAGNOSIS — J00 Acute nasopharyngitis [common cold]: Secondary | ICD-10-CM | POA: Diagnosis not present

## 2021-06-24 DIAGNOSIS — Z20822 Contact with and (suspected) exposure to covid-19: Secondary | ICD-10-CM | POA: Insufficient documentation

## 2021-06-24 DIAGNOSIS — Z7984 Long term (current) use of oral hypoglycemic drugs: Secondary | ICD-10-CM | POA: Insufficient documentation

## 2021-06-24 DIAGNOSIS — E119 Type 2 diabetes mellitus without complications: Secondary | ICD-10-CM | POA: Insufficient documentation

## 2021-06-24 LAB — RESP PANEL BY RT-PCR (FLU A&B, COVID) ARPGX2
Influenza A by PCR: NEGATIVE
Influenza B by PCR: NEGATIVE
SARS Coronavirus 2 by RT PCR: NEGATIVE

## 2021-06-24 LAB — CBG MONITORING, ED: Glucose-Capillary: 164 mg/dL — ABNORMAL HIGH (ref 70–99)

## 2021-06-24 MED ORDER — ACETAMINOPHEN 500 MG PO TABS
1000.0000 mg | ORAL_TABLET | Freq: Once | ORAL | Status: AC
  Administered 2021-06-24: 06:00:00 1000 mg via ORAL
  Filled 2021-06-24: qty 2

## 2021-06-24 NOTE — ED Provider Notes (Signed)
[facility] EMERGENCY DEPARTMENT Provider Note  CSN: [id number] Arrival date & time: 06/24/21 1700  Chief Complaint(s) Otalgia  HPI Alice Oliver is a 25 y.o. female    Otalgia Location:  Bilateral Quality:  Aching Onset quality:  Gradual Duration:  2 days Timing:  Constant Chronicity:  New Context: recent URI   Relieved by:  Nothing Worsened by:  Nothing Associated symptoms: congestion and rhinorrhea   Associated symptoms: no ear discharge, no fever, no hearing loss and no sore throat    Past Medical History Past Medical History:  Diagnosis Date   Diabetes mellitus without complication (HCC)    There are no problems to display for this patient.  Home Medication(s) Prior to Admission medications   Medication Sig Start Date End Date Taking? Authorizing Provider  doxycycline (VIBRAMYCIN) 100 MG capsule Take 1 capsule (100 mg total) by mouth 2 (two) times daily. 12/22/20   Cathren Laine, MD  metFORMIN (GLUCOPHAGE) 500 MG tablet Take 1 tablet (500 mg total) by mouth 2 (two) times daily with a meal. 12/22/20   Cathren Laine, MD                                                                                                                                    Past Surgical History No past surgical history on file. Family History No family history on file.  Social History Social History   Tobacco Use   Smoking status: Never   Smokeless tobacco: Never  Vaping Use   Vaping Use: Never used  Substance Use Topics   Alcohol use: Not Currently   Drug use: Yes    Types: Marijuana   Allergies Patient has no known allergies.  Review of Systems Review of Systems  Constitutional:  Negative for fever.  HENT:  Positive for congestion, ear pain and rhinorrhea. Negative for ear discharge, hearing loss and sore throat.   All other systems are reviewed and are negative for acute change except as noted in the HPI  Physical Exam Vital Signs  I have reviewed the  triage vital signs BP (!) 141/88 (BP Location: Right Arm)   Pulse 95   Temp 98.5 F (36.9 C) (Oral)   Resp 18   Ht 5\' 7"  (1.702 m)   Wt 104.3 kg   LMP 06/14/2021 (Approximate)   SpO2 100%   BMI 36.02 kg/m   Physical Exam Vitals reviewed.  Constitutional:      General: She is not in acute distress.    Appearance: She is well-developed. She is not diaphoretic.  HENT:     Head: Normocephalic and atraumatic.     Right Ear: No drainage. Tympanic membrane is injected. Tympanic membrane is not erythematous or bulging.     Left Ear: No drainage. Tympanic membrane is injected. Tympanic membrane is not erythematous or bulging.     Nose: Congestion and rhinorrhea present.     Mouth/Throat:  Tongue: No lesions.     Palate: No lesions.     Pharynx: Oropharynx is clear.  Eyes:     General: No scleral icterus.       Right eye: No discharge.        Left eye: No discharge.     Conjunctiva/sclera: Conjunctivae normal.     Pupils: Pupils are equal, round, and reactive to light.  Cardiovascular:     Rate and Rhythm: Normal rate and regular rhythm.     Heart sounds: No murmur heard.   No friction rub. No gallop.  Pulmonary:     Effort: Pulmonary effort is normal. No respiratory distress.     Breath sounds: Normal breath sounds. No stridor. No rales.  Abdominal:     General: There is no distension.     Palpations: Abdomen is soft.     Tenderness: There is no abdominal tenderness.  Musculoskeletal:        General: No tenderness.     Cervical back: Normal range of motion and neck supple.  Skin:    General: Skin is warm and dry.     Findings: No erythema or rash.  Neurological:     Mental Status: She is alert and oriented to person, place, and time.    ED Results and Treatments Labs (all labs ordered are listed, but only abnormal results are displayed) Labs Reviewed  CBG MONITORING, ED - Abnormal; Notable for the following components:      Result Value   Glucose-Capillary 164  (*)    All other components within normal limits  RESP PANEL BY RT-PCR (FLU A&B, COVID) [id number]                                                                                                                         EKG  EKG Interpretation  Date/Time:    Ventricular Rate:    PR Interval:    QRS Duration:   QT Interval:    QTC Calculation:   R Axis:     Text Interpretation:         Radiology No results found.  Pertinent labs & imaging results that were available during my care of the patient were reviewed by me and considered in my medical decision making (see MDM for details).  Medications Ordered in ED Medications  acetaminophen (TYLENOL) tablet 1,000 mg (1,000 mg Oral Given 06/24/21 0555)  Procedures Procedures  (including critical care time)  Medical Decision Making / ED Course I have reviewed the nursing notes for this encounter and the patient's prior records (if available in EHR or on provided paperwork).  Alice Oliver was evaluated in Emergency Department on 06/24/2021 for the symptoms described in the history of present illness. She was evaluated in the context of the global COVID-19 pandemic, which necessitated consideration that the patient might be at risk for infection with the SARS-CoV-2 virus that causes COVID-19. Institutional protocols and algorithms that pertain to the evaluation of patients at risk for COVID-19 are in a state of rapid change based on information released by regulatory bodies including the CDC and federal and state organizations. These policies and algorithms were followed during the patient's care in the ED.     Patient presents with viral symptoms for 2 days. Adequate oral hydration. Rest of history as above.  Patient appears well. No signs of toxicity, patient is interactive. No hypoxia, tachypnea or  other signs of respiratory distress. No sign of clinical dehydration. Lung exam clear. Rest of exam as above.  Most consistent with viral illness. COVID/Flu negative.  No evidence suggestive of pharyngitis, bacterial AOM, or PNA.  Chest x-ray not indicated at this time.  Discussed symptomatic treatment with the patient and they will follow closely with their PCP.   Pertinent labs & imaging results that were available during my care of the patient were reviewed by me and considered in my medical decision making:    Final Clinical Impression(s) / ED Diagnoses Final diagnoses:  Acute nasopharyngitis  Acute otalgia, bilateral   The patient appears reasonably screened and/or stabilized for discharge and I doubt any other medical condition or other [facility] requiring further screening, evaluation, or treatment in the ED at this time prior to discharge. Safe for discharge with strict return precautions.  Disposition: Discharge  Condition: Good  I have discussed the results, Dx and Tx plan with the patient/family who expressed understanding and agree(s) with the plan. Discharge instructions discussed at length. The patient/family was given strict return precautions who verbalized understanding of the instructions. No further questions at time of discharge.    ED Discharge Orders     None        Follow Up: Primary care provider  Call  to schedule an appointment for close follow up     This chart was dictated using voice recognition software.  Despite best efforts to proofread,  errors can occur which can change the documentation meaning.    Nira Conn, MD 06/24/21 [phone]

## 2021-06-24 NOTE — Discharge Instructions (Addendum)
You may take over-the-counter medicine for symptomatic relief, such as Tylenol, Motrin, TheraFlu, Alka seltzer , black elderberry, etc. Please limit acetaminophen (Tylenol) to 4000 mg and Ibuprofen (Motrin, Advil, etc.) to 2400 mg for a 24hr period. Please note that other over-the-counter medicine may contain acetaminophen or ibuprofen as a component of their ingredients.   

## 2021-06-24 NOTE — ED Triage Notes (Signed)
Pt has had a few days of nasal congestion for a few days, started having right ear pain yesterday and left ear is hurting today. Pt denies fevers, N/V, or cough.
